# Patient Record
Sex: Female | Born: 1995 | Race: White | Hispanic: No | Marital: Single | Smoking: Never smoker
Health system: Southern US, Community
[De-identification: ages and names within clinical notes are randomized; demographics above are authoritative.]

## PROBLEM LIST (undated history)

## (undated) HISTORY — PX: COLONOSCOPY: SHX174

## (undated) HISTORY — PX: WISDOM TOOTH EXTRACTION: SHX21

---
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
## 2015-06-08 ENCOUNTER — Ambulatory Visit (INDEPENDENT_AMBULATORY_CARE_PROVIDER_SITE_OTHER): Payer: BLUE CROSS/BLUE SHIELD | Admitting: Family Medicine

## 2015-06-08 VITALS — BP 120/80 | HR 104 | Temp 101.2°F | Resp 20 | Ht 59.84 in | Wt 101.8 lb

## 2015-06-08 DIAGNOSIS — R05 Cough: Secondary | ICD-10-CM

## 2015-06-08 DIAGNOSIS — J069 Acute upper respiratory infection, unspecified: Secondary | ICD-10-CM | POA: Diagnosis not present

## 2015-06-08 DIAGNOSIS — B9789 Other viral agents as the cause of diseases classified elsewhere: Secondary | ICD-10-CM

## 2015-06-08 DIAGNOSIS — L509 Urticaria, unspecified: Secondary | ICD-10-CM

## 2015-06-08 DIAGNOSIS — M791 Myalgia, unspecified site: Secondary | ICD-10-CM

## 2015-06-08 DIAGNOSIS — R059 Cough, unspecified: Secondary | ICD-10-CM

## 2015-06-08 DIAGNOSIS — R509 Fever, unspecified: Secondary | ICD-10-CM

## 2015-06-08 LAB — POCT INFLUENZA A/B
Influenza A, POC: NEGATIVE
Influenza B, POC: NEGATIVE

## 2015-06-08 MED ORDER — IBUPROFEN 200 MG PO TABS
200.0000 mg | ORAL_TABLET | Freq: Once | ORAL | Status: AC
  Administered 2015-06-08: 200 mg via ORAL

## 2015-06-08 NOTE — Progress Notes (Signed)
Subjective:    Patient ID: Kristy Jimenez, female    DOB: 1995-05-30, 21 y.o.   MRN: [id number]  HPI This is a pleasant 20 yo female who is accompanied by her mother. She presents today with 1 day of cough, fever, headache, sore throat. Cough productive of cloudy mucus, watery nasal drainage. Frontal headache. No sick contacts. Took some benadryl earlier. Has had hives in past with multiple medications (advil- but can take 1 ibuprofen, cold and flu preparations). Very fatigued.   No past medical history on file. No past surgical history on file. No family history on file. Social History  Substance Use Topics  . Smoking status: Never Smoker   . Smokeless tobacco: Not on file  . Alcohol Use: No      Review of Systems  Constitutional: Positive for fever, chills and fatigue.  HENT: Positive for congestion, rhinorrhea, sinus pressure and sore throat.   Respiratory: Positive for cough. Negative for shortness of breath and wheezing.   Cardiovascular: Negative for chest pain.  Neurological: Positive for headaches.       Objective:   Physical Exam  Constitutional: She is oriented to person, place, and time. She appears well-developed and well-nourished.  HENT:  Head: Normocephalic and atraumatic.  Right Ear: Tympanic membrane, external ear and ear canal normal.  Left Ear: Tympanic membrane, external ear and ear canal normal.  Nose: Mucosal edema and rhinorrhea present.  Mouth/Throat: Uvula is midline and oropharynx is clear and moist.  Eyes: Conjunctivae are normal.  Neck: Normal range of motion. Neck supple.  Cardiovascular: Normal rate, regular rhythm and normal heart sounds.   Pulmonary/Chest: Effort normal and breath sounds normal.  Musculoskeletal: Normal range of motion.  Neurological: She is alert and oriented to person, place, and time.  Skin: Skin is warm and dry.  Psychiatric: She has a normal mood and affect. Her behavior is normal. Judgment and thought content  normal.  Vitals reviewed.     BP 120/80 mmHg  Pulse 104  Temp(Src) 101.2 F (38.4 C) (Oral)  Resp 20  Ht 4' 11.84" (1.52 m)  Wt 101 lb 12.8 oz (46.176 kg)  BMI 19.99 kg/m2  SpO2 95%  LMP  (LMP Unknown) Results for orders placed or performed in visit on 06/08/15  POCT Influenza A/B  Result Value Ref Range   Influenza A, POC Negative Negative   Influenza B, POC Negative Negative   Meds ordered this encounter  Medications  . ibuprofen (ADVIL,MOTRIN) tablet 200 mg    Sig:         Assessment & Plan:  1. Fever, unspecified - POCT Influenza A/B - ibuprofen (ADVIL,MOTRIN) tablet 200 mg; Take 1 tablet (200 mg total) by mouth once.  2. Myalgia - POCT Influenza A/B  3. Cough - POCT Influenza A/B  4. Hives of unknown origin - Ambulatory referral to Allergy  5. Viral URI with cough -  Patient Instructions  For muscle aches, headache, sore throat you can take over the counter acetaminophen or ibuprofen as directed on the package For nasal congestion you can use Afrin nasal spray twice a day for up to 3 days, and /or sudafed, and/or saline nasal spray For cough you can use Delsym cough syrup Please come back to see [place] or go to the emergency department if you are not better in 5 to 7 days or if you develop fever over 101 for more than 48 hours or if you develop wheezing or shortness of breath.  Olean Reeeborah Gessner, FNP-BC  [facility], [facility]  06/10/2015 10:10 PM

## 2015-06-08 NOTE — Patient Instructions (Signed)
For muscle aches, headache, sore throat you can take over the counter acetaminophen or ibuprofen as directed on the package For nasal congestion you can use Afrin nasal spray twice a day for up to 3 days, and /or sudafed, and/or saline nasal spray For cough you can use Delsym cough syrup Please come back to see us or go to the emergency department if you are not better in 5 to 7 days or if you develop fever over 101 for more than 48 hours or if you develop wheezing or shortness of breath.   

## 2016-03-28 ENCOUNTER — Encounter: Payer: Self-pay | Admitting: Nurse Practitioner

## 2016-04-11 ENCOUNTER — Encounter: Payer: Self-pay | Admitting: Physician Assistant

## 2016-04-11 ENCOUNTER — Other Ambulatory Visit (INDEPENDENT_AMBULATORY_CARE_PROVIDER_SITE_OTHER): Payer: BLUE CROSS/BLUE SHIELD

## 2016-04-11 ENCOUNTER — Ambulatory Visit (INDEPENDENT_AMBULATORY_CARE_PROVIDER_SITE_OTHER): Payer: BLUE CROSS/BLUE SHIELD | Admitting: Physician Assistant

## 2016-04-11 VITALS — BP 100/68 | HR 80 | Ht 59.0 in | Wt 108.2 lb

## 2016-04-11 DIAGNOSIS — K5909 Other constipation: Secondary | ICD-10-CM | POA: Diagnosis not present

## 2016-04-11 DIAGNOSIS — K625 Hemorrhage of anus and rectum: Secondary | ICD-10-CM

## 2016-04-11 DIAGNOSIS — R109 Unspecified abdominal pain: Secondary | ICD-10-CM

## 2016-04-11 LAB — CBC WITH DIFFERENTIAL/PLATELET
Basophils Absolute: 0.1 10*3/uL (ref 0.0–0.1)
Basophils Relative: 0.6 % (ref 0.0–3.0)
EOS PCT: 1.8 % (ref 0.0–5.0)
Eosinophils Absolute: 0.2 10*3/uL (ref 0.0–0.7)
HCT: 38.6 % (ref 36.0–46.0)
Hemoglobin: 13.2 g/dL (ref 12.0–15.0)
LYMPHS ABS: 2.5 10*3/uL (ref 0.7–4.0)
Lymphocytes Relative: 25.3 % (ref 12.0–46.0)
MCHC: 34.1 g/dL (ref 30.0–36.0)
MCV: 84.3 fl (ref 78.0–100.0)
MONO ABS: 0.5 10*3/uL (ref 0.1–1.0)
MONOS PCT: 4.6 % (ref 3.0–12.0)
NEUTROS ABS: 6.6 10*3/uL (ref 1.4–7.7)
NEUTROS PCT: 67.7 % (ref 43.0–77.0)
PLATELETS: 302 10*3/uL (ref 150.0–400.0)
RBC: 4.58 Mil/uL (ref 3.87–5.11)
RDW: 14.5 % (ref 11.5–15.5)
WBC: 9.8 10*3/uL (ref 4.0–10.5)

## 2016-04-11 LAB — HIGH SENSITIVITY CRP: CRP, High Sensitivity: 5.1 mg/L — ABNORMAL HIGH (ref 0.000–5.000)

## 2016-04-11 LAB — SEDIMENTATION RATE: Sed Rate: 54 mm/hr — ABNORMAL HIGH (ref 0–20)

## 2016-04-11 MED ORDER — NA SULFATE-K SULFATE-MG SULF 17.5-3.13-1.6 GM/177ML PO SOLN: 1.0000 | Freq: Once | ORAL | 0 refills | Status: AC

## 2016-04-11 NOTE — Patient Instructions (Addendum)
Please go to the basement level to have your labs drawn.  Benefiber daily. Push fluids daily.  You have been scheduled for a colonoscopy. Please follow written instructions given to you at your visit today.  Please pick up your prep supplies at the pharmacy within the next 1-3 days. [facility] [address] [address]  If you use inhalers (even only as needed), please bring them with you on the day of your procedure. Your physician has requested that you go to www.startemmi.com and enter the access code given to you at your visit today. This web site gives a general overview about your procedure. However, you should still follow specific instructions given to you by our office regarding your preparation for the procedure.

## 2016-04-13 NOTE — Progress Notes (Addendum)
Subjective:    Patient ID: Kristy Jimenez, female    DOB: 1996-01-14, 21 y.o.   MRN: [id number]  HPI Kristy Jimenez is a pleasant 21 year old white female, new to GI today self-referred who comes in with complaints of intermittent blood and mucus in her stools. She states that she had seen a gastroenterologist many years ago as a child. She says she was not ever given any definite diagnosis but did suffer from severe anxiety which she feels was causing most of her symptoms. She says that she has chronic issues with irritable bowel which she has had for years with alternating constipation and occasional loose stools. She says her constipation is not severe and usually just has 1-2 days between bowel movements. She always feels better after a bowel movement. Over the past 3 months or so she has started noticing blood intermittently in her stool and mucus. She says the blood is always red and is noted on the tissue and mixed in with her bowel movements. She says this may be occurring 2-3 times per week has not seen any this week so far. He has not had any real change in her alternating bowel habits. She has had some intermittent cramping and she denies any rectal pain pressure or discomfort. Appetite has been fine, weight has been stable. She does not have any food intolerances or lactose intolerance that she is aware of, family history is negative for GI disease, and celiac disease.  Review of Systems Pertinent positive and negative review of systems were noted in the above HPI section.  All other review of systems was otherwise negative.  Outpatient Encounter Prescriptions as of 04/11/2016  Medication Sig  . [EXPIRED] Na Sulfate-K Sulfate-Mg Sulf 17.5-3.13-1.6 GM/180ML SOLN Take 1 kit by mouth once.   No facility-administered encounter medications on file as of 04/11/2016.    No Known Allergies There are no active problems to display for this patient.  Social History   Social History  . Marital status:  Single    Spouse name: N/A  . Number of children: N/A  . Years of education: N/A   Occupational History  . Not on file.   Social History Main Topics  . Smoking status: Never Smoker  . Smokeless tobacco: Never Used  . Alcohol use No  . Drug use: No  . Sexual activity: Not on file   Other Topics Concern  . Not on file   Social History Narrative  . No narrative on file    Ms. Hendriks's family history is not on file.      Objective:    Vitals:   04/11/16 1321  BP: 100/68  Pulse: 80    Physical Exam  well-developed young white female in no acute distress, very pleasant blood pressure 100/68 pulse 80, height 4 foot 11 weight 108 BMI 21.8. 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 We'll need to rule out underlying IBD, rule out local anal rectal pathology i.e.  internal hemorrhoids rectal polyp etc., rule out occult colon lesion.  #2 history of anxiety  Plan; check CBC with differential, sedimentation rate and CRP Schedule for colonoscopy with Dr. Henrene Pastor . Procedure discussed in detail with patient including risks and benefits and she is agreeable to proceed. Patient will be established with Dr. Henrene Pastor. We also discussed management of her mild constipation with addition of Benefiber daily and increasing water intake significantly. Further plans pending results of  colonoscopy.   Lakai Moree S Alissah Redmon PA-C 04/13/2016   Cc: No ref. provider found  Inadvertently routed to me, will forward to Dr. Henrene Pastor for review

## 2016-06-13 ENCOUNTER — Encounter: Payer: BLUE CROSS/BLUE SHIELD | Admitting: Internal Medicine

## 2016-07-06 ENCOUNTER — Encounter: Payer: Self-pay | Admitting: Internal Medicine

## 2016-07-10 ENCOUNTER — Telehealth: Payer: Self-pay | Admitting: Internal Medicine

## 2016-07-10 MED ORDER — NA SULFATE-K SULFATE-MG SULF 17.5-3.13-1.6 GM/177ML PO SOLN: 1.0000 | Freq: Once | ORAL | 0 refills | Status: AC

## 2016-07-10 NOTE — Telephone Encounter (Signed)
Suprep sent to [facility]

## 2016-07-20 ENCOUNTER — Encounter: Payer: BLUE CROSS/BLUE SHIELD | Admitting: Internal Medicine

## 2018-09-26 ENCOUNTER — Other Ambulatory Visit: Payer: Self-pay

## 2018-09-26 ENCOUNTER — Ambulatory Visit
Admission: EM | Admit: 2018-09-26 | Discharge: 2018-09-26 | Disposition: A | Discharge status: Home / Self Care | Payer: BC Managed Care – PPO | Attending: Family Medicine | Admitting: Family Medicine

## 2018-09-26 ENCOUNTER — Encounter: Payer: Self-pay | Admitting: Family Medicine

## 2018-09-26 DIAGNOSIS — J029 Acute pharyngitis, unspecified: Secondary | ICD-10-CM | POA: Insufficient documentation

## 2018-09-26 LAB — POCT RAPID STREP A (OFFICE): Rapid Strep A Screen: NEGATIVE

## 2018-09-26 NOTE — ED Triage Notes (Signed)
Pt presents to [facility] for assessment of sore throat x 2 days, with runny nose, nasal congestion.  Denies headache, denies chills.

## 2018-09-26 NOTE — ED Provider Notes (Signed)
[facility]   [id number] 09/26/18 Arrival Time: 1449  ASSESSMENT & PLAN:  1. Sore throat     No signs of peritonsillar abscess. Discussed. Work note given.  Results for orders placed or performed during the hospital encounter of 09/26/18  POCT rapid strep A  Result Value Ref Range   Rapid Strep A Screen Negative Negative   Labs Reviewed  POCT RAPID STREP A (OFFICE) - Normal  CULTURE, GROUP A STREP [facility])    OTC analgesics and throat care as needed    Discharge Instructions      You may use over the counter ibuprofen or acetaminophen as needed.   For a sore throat, over the counter products such as [employer] Peroxyl Mouth Sore Rinse or Chloraseptic Sore Throat Spray may provide some temporary relief.  Your rapid strep test was negative today. We have sent your throat swab for culture and will let you know of any positive results.   Reviewed expectations re: course of current medical issues. Questions answered. Outlined signs and symptoms indicating need for more acute intervention. Patient verbalized understanding. After Visit Summary given.   SUBJECTIVE:  Kristy Jimenez is a 23 y.o. female who reports a sore throat. Describes as "irritated". Onset abrupt beginning 2-3 d ago. Mild nasal congestion. No respiratory symptoms. Normal PO intake but reports discomfort with swallowing. Fever reported: no. No neck pain or swelling. No associated n/v/abdominal symptoms. Sick contacts: none known.  OTC treatment: none reported.  ROS: As per HPI.   OBJECTIVE:  Vitals:   09/26/18 1459  BP: 131/85  Pulse: 91  Resp: 16  Temp: 98.4 F (36.9 C)  TempSrc: Oral  SpO2: 98%     General appearance: alert; no distress HEENT: throat with moderate erythema; uvula midline: yes; no tonsil enlargement or exudates Neck: supple with FROM; small bilateral cervical LAD, nontender CV: RRR Lungs: clear to auscultation bilaterally Abd: soft; non-tender Skin: reveals no  rash; warm and dry Psychological: alert and cooperative; normal mood and affect  No Known Allergies   Social History   Socioeconomic History  . Marital status: Single    Spouse name: Not on file  . Number of children: Not on file  . Years of education: Not on file  . Highest education level: Not on file  Occupational History  . Not on file  Social Needs  . Financial resource strain: Not on file  . Food insecurity    Worry: Not on file    Inability: Not on file  . Transportation needs    Medical: Not on file    Non-medical: Not on file  Tobacco Use  . Smoking status: Never Smoker  . Smokeless tobacco: Never Used  Substance and Sexual Activity  . Alcohol use: No    Alcohol/week: 0.0 standard drinks  . Drug use: No  . Sexual activity: Not on file  Lifestyle  . Physical activity    Days per week: Not on file    Minutes per session: Not on file  . Stress: Not on file  Relationships  . Social Musicianconnections    Talks on phone: Not on file    Gets together: Not on file    Attends religious service: Not on file    Active member of club or organization: Not on file    Attends meetings of clubs or organizations: Not on file    Relationship status: Not on file  . Intimate partner violence    Fear of current or ex  partner: Not on file    Emotionally abused: Not on file    Physically abused: Not on file    Forced sexual activity: Not on file  Other Topics Concern  . Not on file  Social History Narrative  . Not on file   History reviewed. No pertinent family history.        Vanessa Kick, MD 09/26/18 1556

## 2018-09-26 NOTE — Discharge Instructions (Signed)
You may use over the counter ibuprofen or acetaminophen as needed.  For a sore throat, over the counter products such as [employer]gate Peroxyl Mouth Sore Rinse or Chloraseptic Sore Throat Spray may provide some temporary relief. Your rapid strep test was negative today. We have sent your throat swab for culture and will let you know of any positive results. 

## 2018-09-26 NOTE — ED Notes (Signed)
Patient able to ambulate independently  

## 2018-09-29 LAB — CULTURE, GROUP A STREP (THRC)
